# Patient Record
Sex: Male | Born: 1940 | Race: White | Hispanic: No | Marital: Single
Health system: Southern US, Community
[De-identification: ages and names within clinical notes are randomized; demographics above are authoritative.]

---

## 2009-12-16 ENCOUNTER — Ambulatory Visit: Payer: Self-pay | Admitting: Internal Medicine

## 2009-12-24 ENCOUNTER — Emergency Department: Payer: Self-pay | Admitting: Emergency Medicine

## 2010-01-16 ENCOUNTER — Ambulatory Visit: Payer: Self-pay | Admitting: Internal Medicine

## 2010-02-15 ENCOUNTER — Ambulatory Visit: Payer: Self-pay | Admitting: Internal Medicine

## 2014-04-25 ENCOUNTER — Emergency Department: Payer: Self-pay | Admitting: Emergency Medicine
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
## 2018-05-22 ENCOUNTER — Telehealth: Payer: Self-pay | Admitting: Gastroenterology

## 2018-05-22 NOTE — Telephone Encounter (Signed)
  Gastroenterology Pre-Procedure Review  Request Date: 06/26/18   [facility] Requesting Physician: Dr. Tobi BastosAnna  PATIENT REVIEW QUESTIONS: The patient responded to the following health history questions as indicated:    1. Are you having any GI issues? no 2. Do you have a personal history of Polyps? yes (5 yrs ago ???) 3. Do you have a family history of Colon Cancer or Polyps? no 4. 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 Joint replacements in the past 12 months?no 6. Major health problems in the past 3 months?yes (Diagnosed w/ Lukemia and refuses treatment.) 7. Any artificial heart valves, MVP, or defibrillator?no    MEDICATIONS & ALLERGIES:    Patient reports the following regarding taking any anticoagulation/antiplatelet therapy:   Plavix, Coumadin, Eliquis, Xarelto, Lovenox, Pradaxa, Brilinta, or Effient? no Aspirin? no  Patient confirms/reports the following medications:  No current outpatient medications on file.   No current facility-administered medications for this visit.     Patient confirms/reports the following allergies:  Allergies not on file  No orders of the defined types were placed in this encounter.   AUTHORIZATION INFORMATION Primary Insurance: 1D#: Group #:  Secondary Insurance: 1D#: Group #:  SCHEDULE INFORMATION: Date: 06/26/18    Tobi BastosAnna Time: Location:  [facility]

## 2018-05-23 ENCOUNTER — Other Ambulatory Visit: Payer: Self-pay

## 2018-05-23 DIAGNOSIS — Z86010 Personal history of colonic polyps: Secondary | ICD-10-CM

## 2018-06-06 ENCOUNTER — Telehealth: Payer: Self-pay | Admitting: Gastroenterology

## 2018-06-06 NOTE — Telephone Encounter (Signed)
Patient called to cancel procedure. He wanted to know what is co pay would be. I explained to him that the preservice center would call him to discuss this. He said to cancel appt. I called [facility] to let them know.

## 2018-06-12 ENCOUNTER — Telehealth: Payer: Self-pay | Admitting: Gastroenterology

## 2018-06-12 NOTE — Telephone Encounter (Signed)
Colonoscopy scheduled for 06/26/18 with Dr. Tobi BastosAnna has been canceled with Endoscopy.  Per phone call from [facility].  Thanks [employer]

## 2018-06-12 NOTE — Telephone Encounter (Signed)
Angel from the [facility] left vm to cancel pts procedure 06/26/18 any questions call [phone]

## 2018-06-26 ENCOUNTER — Ambulatory Visit: Admit: 2018-06-26 | Payer: Non-veteran care | Admitting: Gastroenterology

## 2018-06-26 SURGERY — COLONOSCOPY WITH PROPOFOL
Anesthesia: General

## 2020-09-07 ENCOUNTER — Other Ambulatory Visit: Payer: Self-pay

## 2020-09-07 ENCOUNTER — Emergency Department
Admission: EM | Admit: 2020-09-07 | Discharge: 2020-09-07 | Discharge status: Against Medical Advice | Payer: No Typology Code available for payment source | Attending: Emergency Medicine | Admitting: Emergency Medicine

## 2020-09-07 DIAGNOSIS — J029 Acute pharyngitis, unspecified: Secondary | ICD-10-CM | POA: Insufficient documentation

## 2020-09-07 DIAGNOSIS — R0981 Nasal congestion: Secondary | ICD-10-CM | POA: Diagnosis not present

## 2020-09-07 DIAGNOSIS — R519 Headache, unspecified: Secondary | ICD-10-CM | POA: Insufficient documentation

## 2020-09-07 DIAGNOSIS — Z20822 Contact with and (suspected) exposure to covid-19: Secondary | ICD-10-CM | POA: Insufficient documentation

## 2020-09-07 LAB — RESP PANEL BY RT-PCR (FLU A&B, COVID) ARPGX2
Influenza A by PCR: NEGATIVE
Influenza B by PCR: NEGATIVE
SARS Coronavirus 2 by RT PCR: NEGATIVE

## 2020-09-07 LAB — GROUP A STREP BY PCR: Group A Strep by PCR: NOT DETECTED

## 2020-09-07 NOTE — ED Triage Notes (Signed)
Patient reports woke Saturday with headache and sore throat.  States took some over the counter medicine and when he woke up tonight he was really stuffy.

## 2020-09-07 NOTE — ED Notes (Addendum)
Patient left with all belongings prior  RN being able to assess patient ~10 minutes after being roomed from triage. Patient AMA and MD made aware.

## 2020-09-07 NOTE — ED Provider Notes (Signed)
[facility] Emergency Department Provider Note   ____________________________________________   First MD Initiated Contact with Patient 09/07/20 [phone]     (approximate)  I have reviewed the triage vital signs and the nursing notes.   HISTORY  Chief Complaint Headache and Sore Throat    HPI Justin Lucero is a 79 y.o. male who presents to the ED from home with a chief complaint of mild frontal headache, headache, sore throat and nasal congestion.  States he took Sudafed which usually clears up his congestion but it did not work this time.  Denies fever, chills, cough, chest pain, shortness of breath, abdominal pain, nausea, vomiting or dizziness.  Patient has been fully vaccinated against COVID-19 plus taking his booster.     Past medical history CLL  There are no problems to display for this patient.   Prior to Admission medications   Not on File    Allergies Patient has no known allergies.  No family history on file.  Social History Social History   Tobacco Use  . Smoking status: Not on file  Substance Use Topics  . Alcohol use: Not on file  . Drug use: Not on file  No EtOH  Review of Systems  Constitutional: No fever/chills Eyes: No visual changes. ENT: Positive for nasal congestion and sore throat. Cardiovascular: Denies chest pain. Respiratory: Denies shortness of breath. Gastrointestinal: No abdominal pain.  No nausea, no vomiting.  No diarrhea.  No constipation. Genitourinary: Negative for dysuria. Musculoskeletal: Negative for back pain. Skin: Negative for rash. Neurological: Negative for headaches, focal weakness or numbness.   ____________________________________________   PHYSICAL EXAM:  VITAL SIGNS: ED Triage Vitals [09/07/20 0304]  Enc Vitals Group     BP (!) 159/78     Pulse Rate 72     Resp 18     Temp 98.2 F (36.8 C)     Temp Source Oral     SpO2 98 %     Weight 245 lb (111.1 kg)     Height 6\' 3"   (1.905 m)     Head Circumference      Peak Flow      Pain Score      Pain Loc      Pain Edu?      Excl. in GC?     Constitutional: Alert and oriented. Well appearing and in no acute distress. Eyes: Conjunctivae are normal. PERRL. EOMI. Head: Atraumatic. Nose: Congestion. Mouth/Throat: Mucous membranes are moist.  Oropharynx mildly erythematous without tonsillar swelling, exudates or peritonsillar abscess.  Postnasal drip. There is no hoarse or muffled voice.  There is no drooling. Neck: No stridor.   Hematological: No LAD. Cardiovascular: Normal rate, regular rhythm. Grossly normal heart sounds.  Good peripheral circulation. Respiratory: Normal respiratory effort.  No retractions. Lungs CTAB. Gastrointestinal: Soft and nontender. No distention. No abdominal bruits. No CVA tenderness. Musculoskeletal: No lower extremity tenderness nor edema.  No joint effusions. Neurologic:  Normal speech and language. No gross focal neurologic deficits are appreciated. No gait instability. Skin:  Skin is warm, dry and intact. No rash noted.  No petechiae. Psychiatric: Mood and affect are normal. Speech and behavior are normal.  ____________________________________________   LABS (all labs ordered are listed, but only abnormal results are displayed)  Labs Reviewed  RESP PANEL BY RT-PCR (FLU A&B, COVID) ARPGX2  GROUP A STREP BY PCR   ____________________________________________  EKG  None ____________________________________________  RADIOLOGY I, Fenris Cauble J, personally viewed and evaluated these images (  plain radiographs) as part of my medical decision making, as well as reviewing the written report by the radiologist.  ED MD interpretation: None  Official radiology report(s): No results found.  ____________________________________________   PROCEDURES  Procedure(s) performed (including Critical Care):  Procedures   ____________________________________________   INITIAL  IMPRESSION / ASSESSMENT AND PLAN / ED COURSE  As part of my medical decision making, I reviewed the following data within the electronic MEDICAL RECORD NUMBER Nursing notes reviewed and incorporated, Old chart reviewed (oncology) and Notes from prior ED visits (none)     79 year old male presenting for nasal congestion and sore throat.  Respiratory panel and rapid strep swabs are pending.  Patient dissatisfied and states he does not want to wait for results.  Wants a tablet for his congestion.  Does not want nasal spray.  From his description, it sounds like he wants oxymetazoline in a tablet form rather than nasal spray.  I offered to call pharmacy to see if this comes in tablet form and to see if we have it.  Patient dissatisfied with offers for saline nose rinse, Tylenol and Magic mouthwash.  It is unclear to me why patient is angry as he was brought straight back to a treatment room and seen with minimal wait time.  Ultimately patient stomped out of the room and left prior to receiving paperwork.  He is afebrile, neurologically intact with supple neck and appears to have mild cold symptoms.  Will follow up results of respiratory panel and rapid strep, and will call patient with abnormal results.   Clinical Course as of Sep 07 633  Wynelle Link Sep 07, 2020  9892 Chart review: Noted respiratory panel and rapid strep negative   [JS]    Clinical Course User Index [JS] Irean Hong, MD     ____________________________________________   FINAL CLINICAL IMPRESSION(S) / ED DIAGNOSES  Final diagnoses:  Sore throat  Nasal congestion     ED Discharge Orders    None      *Please note:  Justin Lucero was evaluated in Emergency Department on 09/07/2020 for the symptoms described in the history of present illness. He was evaluated in the context of the global COVID-19 pandemic, which necessitated consideration that the patient might be at risk for infection with the SARS-CoV-2 virus that causes COVID-19.  Institutional protocols and algorithms that pertain to the evaluation of patients at risk for COVID-19 are in a state of rapid change based on information released by regulatory bodies including the CDC and federal and state organizations. These policies and algorithms were followed during the patient's care in the ED.  Some ED evaluations and interventions may be delayed as a result of limited staffing during and the pandemic.*   Note:  This document was prepared using Dragon voice recognition software and may include unintentional dictation errors.   Irean Hong, MD 09/07/20 [phone]

## 2024-07-05 ENCOUNTER — Ambulatory Visit: Admitting: Dermatology
